# Patient Record
Sex: Female | Born: 1980 | Race: White | Hispanic: No | Marital: Married | Smoking: Never smoker
Health system: Southern US, Community
[De-identification: ages and names within clinical notes are randomized; demographics above are authoritative.]

---

## 2004-06-06 HISTORY — PX: AUGMENTATION MAMMAPLASTY: SUR837

## 2016-10-04 ENCOUNTER — Ambulatory Visit: Payer: Self-pay | Admitting: Physician Assistant

## 2016-10-04 ENCOUNTER — Encounter: Payer: Self-pay | Admitting: Physician Assistant

## 2016-10-04 VITALS — BP 110/80 | HR 73 | Temp 98.3°F

## 2016-10-04 DIAGNOSIS — L237 Allergic contact dermatitis due to plants, except food: Secondary | ICD-10-CM

## 2016-10-04 MED ORDER — DEXAMETHASONE SODIUM PHOSPHATE 10 MG/ML IJ SOLN
10.0000 mg | Freq: Once | INTRAMUSCULAR | Status: AC
  Administered 2016-10-04: 10 mg via INTRAMUSCULAR

## 2016-10-04 NOTE — Progress Notes (Signed)
S: c/o itchy rash on face and  arms,  was outside in yard and then broke out, sx for few days, tried multiple otc meds without relief, denies fever/chills, has a dose pack at home but would rather get the shot  O: vitals wnl, nad, lungs c t a, cv rrr, skin with small raised red areas some with streaks/blisters, no drainage, n/v intact  A: acute contact dermatitis  P: decadron 10 mg Im

## 2017-01-19 DIAGNOSIS — R1013 Epigastric pain: Secondary | ICD-10-CM | POA: Diagnosis not present

## 2017-01-19 DIAGNOSIS — Z800 Family history of malignant neoplasm of digestive organs: Secondary | ICD-10-CM | POA: Diagnosis not present

## 2017-01-19 DIAGNOSIS — Z1211 Encounter for screening for malignant neoplasm of colon: Secondary | ICD-10-CM | POA: Diagnosis not present

## 2017-01-19 DIAGNOSIS — K5904 Chronic idiopathic constipation: Secondary | ICD-10-CM | POA: Diagnosis not present

## 2017-01-19 DIAGNOSIS — K219 Gastro-esophageal reflux disease without esophagitis: Secondary | ICD-10-CM | POA: Diagnosis not present

## 2017-01-31 DIAGNOSIS — N907 Vulvar cyst: Secondary | ICD-10-CM | POA: Diagnosis not present

## 2017-03-06 DIAGNOSIS — Z1211 Encounter for screening for malignant neoplasm of colon: Secondary | ICD-10-CM | POA: Diagnosis not present

## 2017-03-06 DIAGNOSIS — Z800 Family history of malignant neoplasm of digestive organs: Secondary | ICD-10-CM | POA: Diagnosis not present

## 2017-03-21 DIAGNOSIS — Z01419 Encounter for gynecological examination (general) (routine) without abnormal findings: Secondary | ICD-10-CM | POA: Diagnosis not present

## 2018-11-21 DIAGNOSIS — Z1329 Encounter for screening for other suspected endocrine disorder: Secondary | ICD-10-CM | POA: Diagnosis not present

## 2018-11-21 DIAGNOSIS — Z719 Counseling, unspecified: Secondary | ICD-10-CM | POA: Diagnosis not present

## 2018-11-21 DIAGNOSIS — Z13228 Encounter for screening for other metabolic disorders: Secondary | ICD-10-CM | POA: Diagnosis not present

## 2018-11-21 DIAGNOSIS — Z01419 Encounter for gynecological examination (general) (routine) without abnormal findings: Secondary | ICD-10-CM | POA: Diagnosis not present

## 2018-11-21 DIAGNOSIS — Z13220 Encounter for screening for lipoid disorders: Secondary | ICD-10-CM | POA: Diagnosis not present

## 2018-11-21 DIAGNOSIS — Z9229 Personal history of other drug therapy: Secondary | ICD-10-CM | POA: Diagnosis not present

## 2018-11-28 ENCOUNTER — Other Ambulatory Visit: Payer: Self-pay | Admitting: Family

## 2018-11-28 DIAGNOSIS — Z1231 Encounter for screening mammogram for malignant neoplasm of breast: Secondary | ICD-10-CM

## 2018-12-11 DIAGNOSIS — H5213 Myopia, bilateral: Secondary | ICD-10-CM | POA: Diagnosis not present

## 2018-12-11 DIAGNOSIS — Z0100 Encounter for examination of eyes and vision without abnormal findings: Secondary | ICD-10-CM | POA: Diagnosis not present

## 2019-01-14 DIAGNOSIS — N898 Other specified noninflammatory disorders of vagina: Secondary | ICD-10-CM | POA: Diagnosis not present

## 2019-02-07 ENCOUNTER — Ambulatory Visit
Admission: RE | Admit: 2019-02-07 | Discharge: 2019-02-07 | Disposition: A | Discharge status: Home / Self Care | Payer: 59 | Source: Ambulatory Visit | Attending: Family | Admitting: Family

## 2019-02-07 ENCOUNTER — Other Ambulatory Visit: Payer: Self-pay | Admitting: Family

## 2019-02-07 DIAGNOSIS — Z1231 Encounter for screening mammogram for malignant neoplasm of breast: Secondary | ICD-10-CM | POA: Diagnosis not present

## 2019-02-18 ENCOUNTER — Other Ambulatory Visit: Payer: Self-pay | Admitting: Family

## 2019-02-18 DIAGNOSIS — N6489 Other specified disorders of breast: Secondary | ICD-10-CM

## 2019-02-18 DIAGNOSIS — R928 Other abnormal and inconclusive findings on diagnostic imaging of breast: Secondary | ICD-10-CM

## 2019-02-27 ENCOUNTER — Ambulatory Visit
Admission: RE | Admit: 2019-02-27 | Discharge: 2019-02-27 | Disposition: A | Discharge status: Home / Self Care | Payer: 59 | Source: Ambulatory Visit | Attending: Family | Admitting: Family

## 2019-02-27 DIAGNOSIS — R928 Other abnormal and inconclusive findings on diagnostic imaging of breast: Secondary | ICD-10-CM

## 2019-02-27 DIAGNOSIS — N6321 Unspecified lump in the left breast, upper outer quadrant: Secondary | ICD-10-CM | POA: Diagnosis not present

## 2019-02-27 DIAGNOSIS — R922 Inconclusive mammogram: Secondary | ICD-10-CM | POA: Diagnosis not present

## 2019-02-27 DIAGNOSIS — N6489 Other specified disorders of breast: Secondary | ICD-10-CM | POA: Diagnosis not present

## 2019-02-27 DIAGNOSIS — N6323 Unspecified lump in the left breast, lower outer quadrant: Secondary | ICD-10-CM | POA: Diagnosis not present

## 2019-03-06 ENCOUNTER — Other Ambulatory Visit: Payer: Self-pay | Admitting: Family

## 2019-03-06 DIAGNOSIS — N6320 Unspecified lump in the left breast, unspecified quadrant: Secondary | ICD-10-CM

## 2019-07-03 ENCOUNTER — Other Ambulatory Visit
Admission: RE | Admit: 2019-07-03 | Discharge: 2019-07-03 | Disposition: A | Discharge status: Home / Self Care | Payer: 59 | Source: Ambulatory Visit | Attending: Plastic Surgery | Admitting: Plastic Surgery

## 2019-07-03 DIAGNOSIS — D509 Iron deficiency anemia, unspecified: Secondary | ICD-10-CM | POA: Insufficient documentation

## 2019-07-03 LAB — CBC
HCT: 33.1 % — ABNORMAL LOW (ref 36.0–46.0)
Hemoglobin: 10.7 g/dL — ABNORMAL LOW (ref 12.0–15.0)
MCH: 29 pg (ref 26.0–34.0)
MCHC: 32.3 g/dL (ref 30.0–36.0)
MCV: 89.7 fL (ref 80.0–100.0)
Platelets: 288 10*3/uL (ref 150–400)
RBC: 3.69 MIL/uL — ABNORMAL LOW (ref 3.87–5.11)
RDW: 13.4 % (ref 11.5–15.5)
WBC: 7.1 10*3/uL (ref 4.0–10.5)
nRBC: 0 % (ref 0.0–0.2)

## 2019-07-18 DIAGNOSIS — Z719 Counseling, unspecified: Secondary | ICD-10-CM | POA: Diagnosis not present

## 2019-07-18 DIAGNOSIS — D649 Anemia, unspecified: Secondary | ICD-10-CM | POA: Diagnosis not present

## 2019-07-18 DIAGNOSIS — R3915 Urgency of urination: Secondary | ICD-10-CM | POA: Diagnosis not present

## 2019-08-19 ENCOUNTER — Other Ambulatory Visit
Admission: RE | Admit: 2019-08-19 | Discharge: 2019-08-19 | Disposition: A | Discharge status: Home / Self Care | Payer: 59 | Source: Ambulatory Visit | Attending: Family | Admitting: Family

## 2019-08-19 DIAGNOSIS — D649 Anemia, unspecified: Secondary | ICD-10-CM | POA: Diagnosis not present

## 2019-08-19 LAB — CBC WITH DIFFERENTIAL/PLATELET
Abs Immature Granulocytes: 0.01 10*3/uL (ref 0.00–0.07)
Basophils Absolute: 0 10*3/uL (ref 0.0–0.1)
Basophils Relative: 0 %
Eosinophils Absolute: 0 10*3/uL (ref 0.0–0.5)
Eosinophils Relative: 0 %
HCT: 36.6 % (ref 36.0–46.0)
Hemoglobin: 11.7 g/dL — ABNORMAL LOW (ref 12.0–15.0)
Immature Granulocytes: 0 %
Lymphocytes Relative: 31 %
Lymphs Abs: 1.7 10*3/uL (ref 0.7–4.0)
MCH: 29 pg (ref 26.0–34.0)
MCHC: 32 g/dL (ref 30.0–36.0)
MCV: 90.8 fL (ref 80.0–100.0)
Monocytes Absolute: 0.5 10*3/uL (ref 0.1–1.0)
Monocytes Relative: 8 %
Neutro Abs: 3.5 10*3/uL (ref 1.7–7.7)
Neutrophils Relative %: 61 %
Platelets: 283 10*3/uL (ref 150–400)
RBC: 4.03 MIL/uL (ref 3.87–5.11)
RDW: 13.4 % (ref 11.5–15.5)
WBC: 5.7 10*3/uL (ref 4.0–10.5)
nRBC: 0 % (ref 0.0–0.2)

## 2019-08-19 LAB — IRON AND TIBC
Iron: 88 ug/dL (ref 28–170)
Saturation Ratios: 22 % (ref 10.4–31.8)
TIBC: 406 ug/dL (ref 250–450)
UIBC: 318 ug/dL

## 2019-10-21 ENCOUNTER — Ambulatory Visit
Admission: RE | Admit: 2019-10-21 | Discharge: 2019-10-21 | Disposition: A | Discharge status: Home / Self Care | Payer: 59 | Source: Ambulatory Visit | Attending: Family | Admitting: Family

## 2019-10-21 DIAGNOSIS — N6320 Unspecified lump in the left breast, unspecified quadrant: Secondary | ICD-10-CM | POA: Diagnosis not present

## 2019-10-23 ENCOUNTER — Other Ambulatory Visit: Payer: Self-pay | Admitting: Family

## 2019-10-23 DIAGNOSIS — N6320 Unspecified lump in the left breast, unspecified quadrant: Secondary | ICD-10-CM

## 2020-04-07 IMAGING — MG MM DIGITAL DIAGNOSTIC UNILAT*L* IMPLANT W/ TOMO W/ CAD
8 series · 9 of 24 positions shown · non-contrast
Comparison: Baseline screening mammogram 02/07/2019

CLINICAL DATA: Screening recall from baseline for a left breast
asymmetry.

EXAM:
DIGITAL DIAGNOSTIC LEFT MAMMOGRAM WITH IMPLANTS, CAD AND TOMO
ULTRASOUND LEFT BREAST
The patient has retropectoral implants. Standard and implant
displaced views were performed.

[L MLO synth-2D]
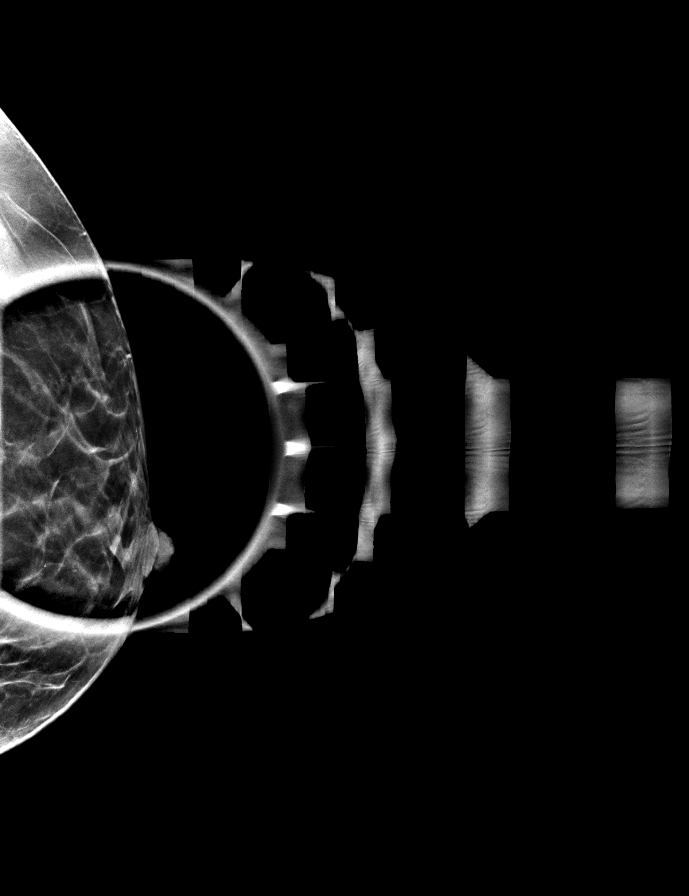

[L ML synth-2D (1 of 2)]
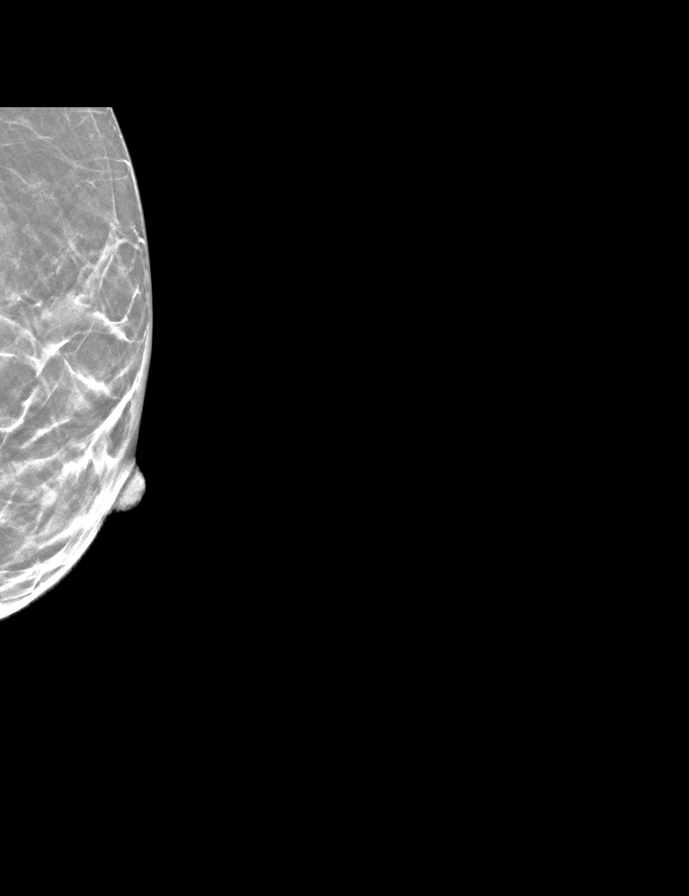

[L CC synth-2D]
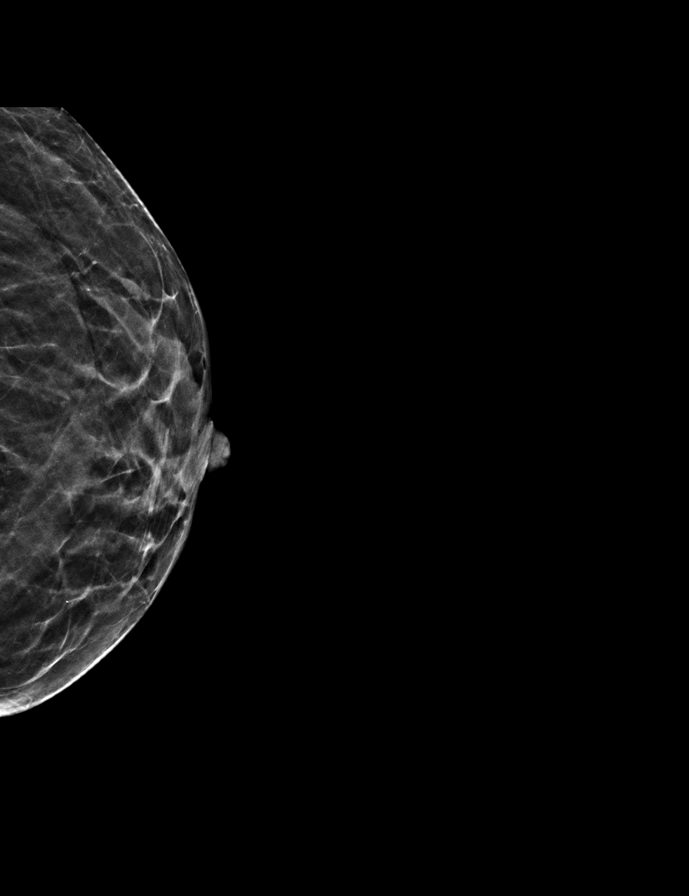

[L ML synth-2D (2 of 2)]
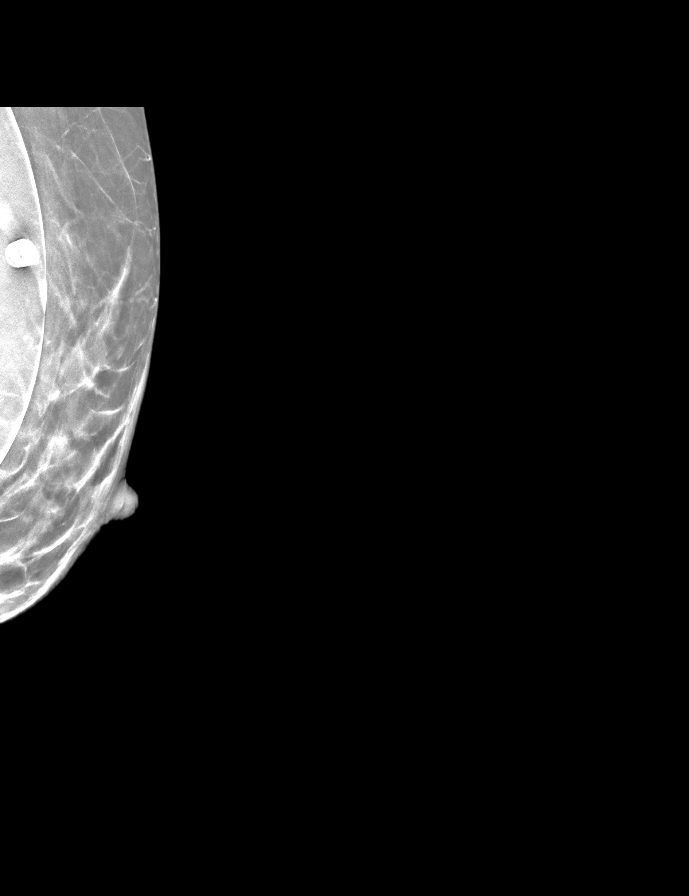

[L MLID BREAST TOMOSYNTHESIS IMAGE tomo · 2 of 44 frames shown (1 of 2)]
[frame 15/44]
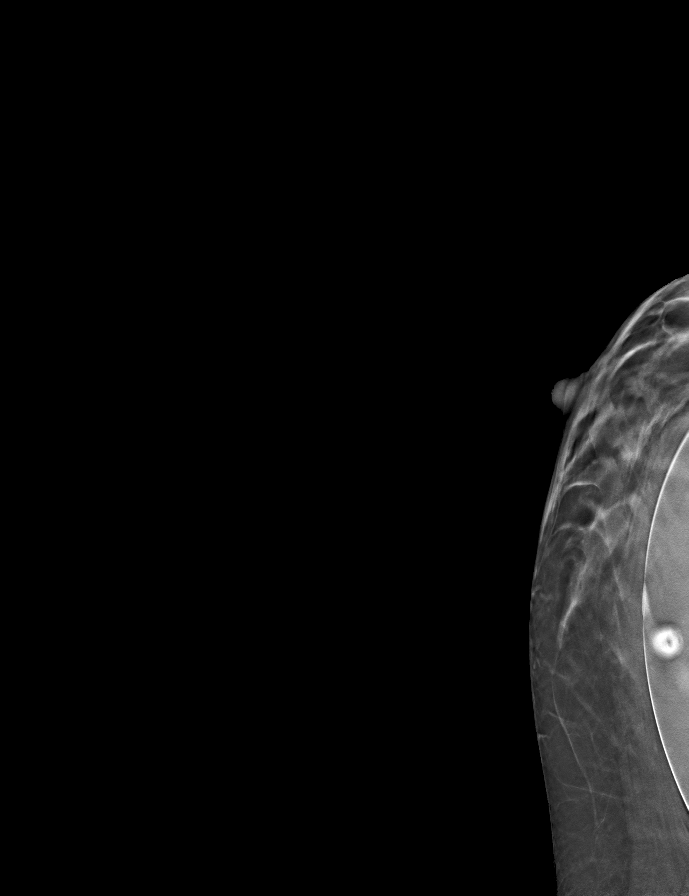
[frame 23/44]
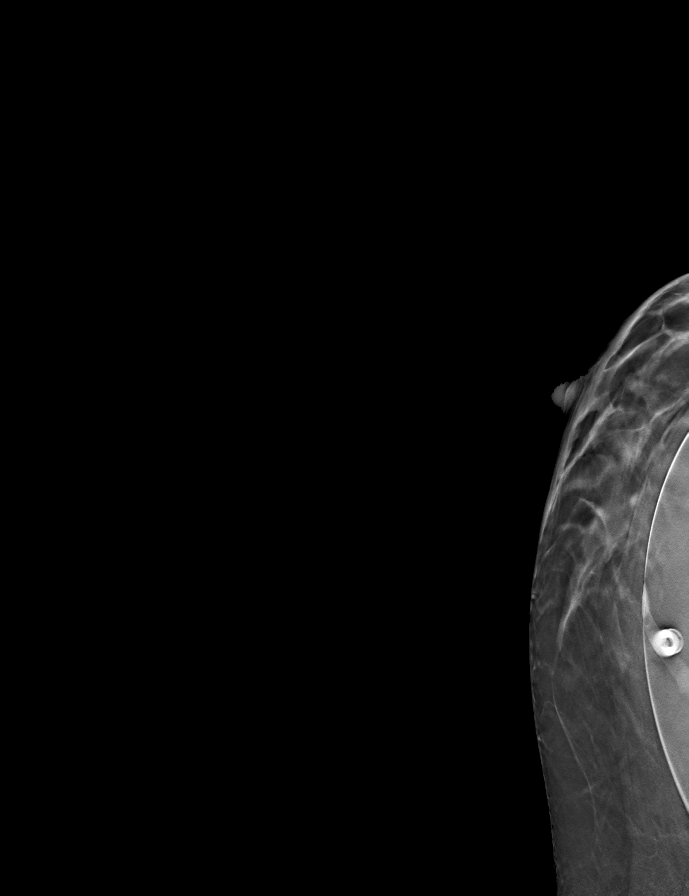

[L CCID BREAST TOMOSYNTHESIS IMAGE tomo · tomo slice 17/33.0]
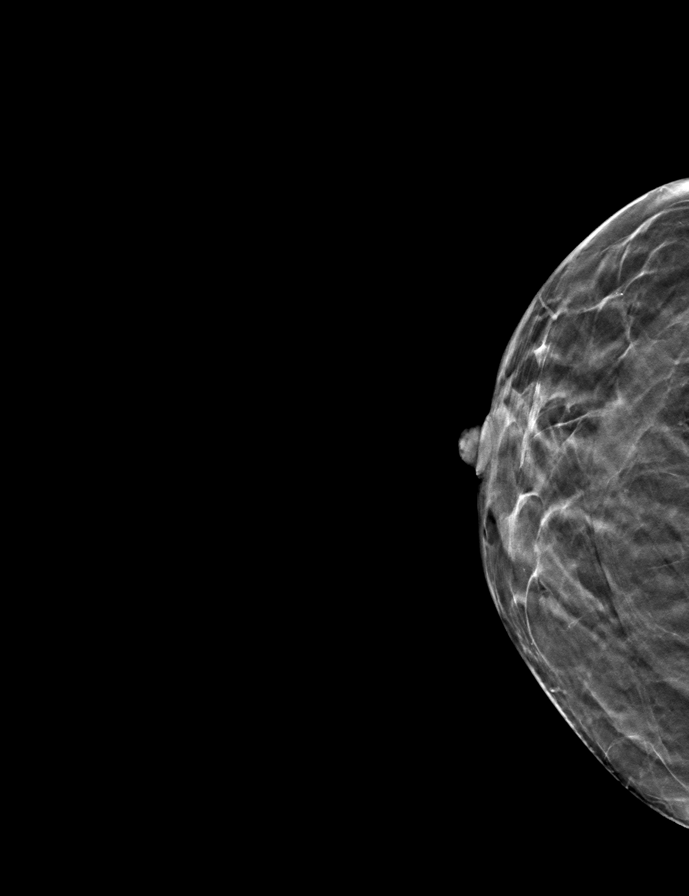

[L MLID BREAST TOMOSYNTHESIS IMAGE tomo (2 of 2) · tomo slice 15/29.0]
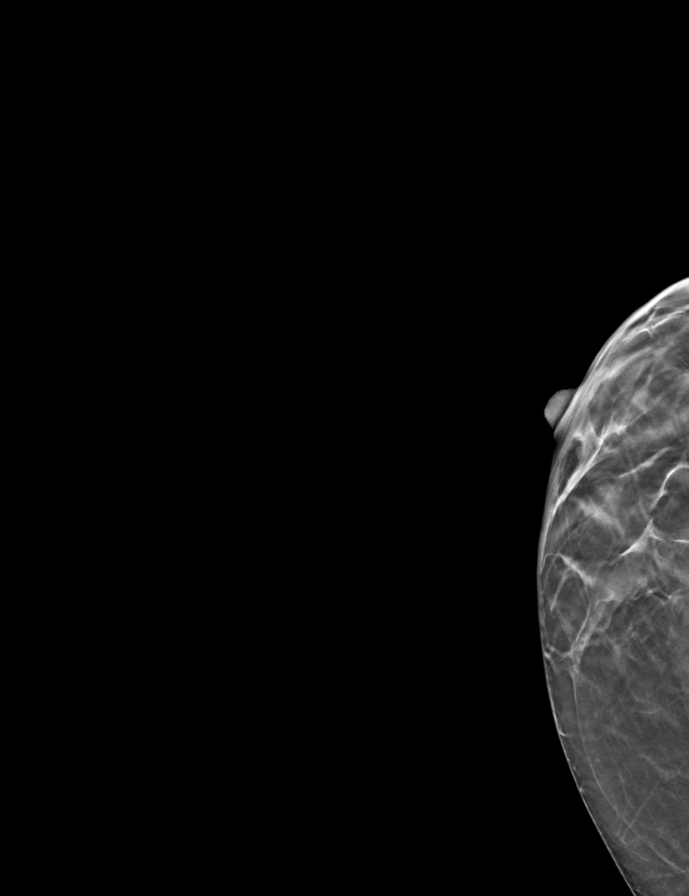

[L MLOID BREAST TOMOSYNTHESIS IMAGE tomo · tomo slice 19/37.0]
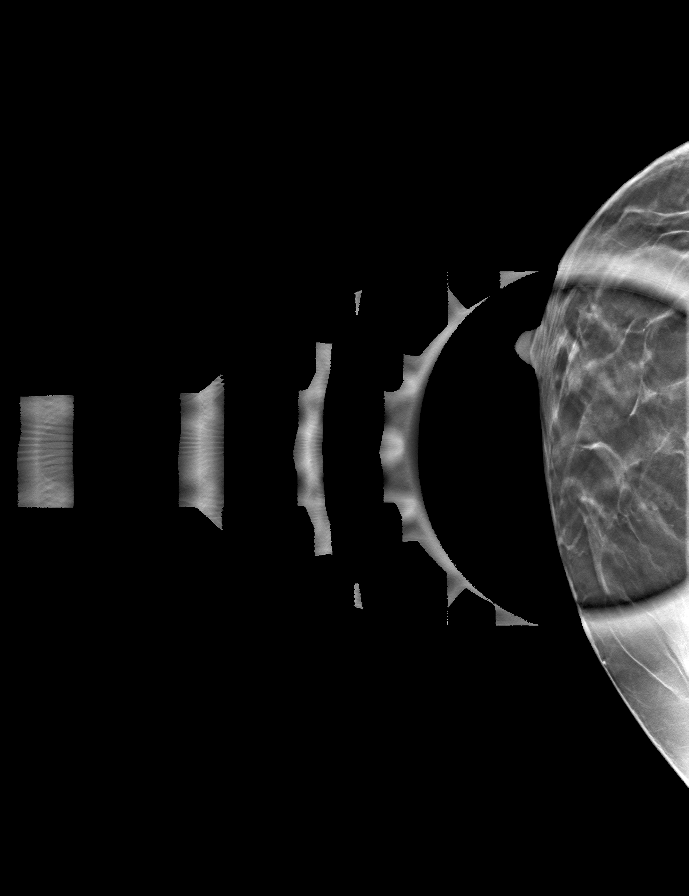

[9 of 24 positions shown; findings below may reference images not displayed]

ACR Breast Density Category c: The breast tissue is heterogeneously
dense, which may obscure small masses.
FINDINGS: Spot compression tomosynthesis images reveal a persistent oval
circumscribed mass in the lateral left breast.

Ultrasound targeted to the left breast at 3 o'clock, 2 cm from the
nipple demonstrates a hypoechoic oval circumscribed mass measuring 9
x 2 x 6 mm.

Mammographic images were processed with CAD.
IMPRESSION: 1. The left breast mass at 3 o'clock is likely benign, possibly
representing a fibroadenoma.

RECOMMENDATION:
Six-month follow-up left breast ultrasound

I have discussed the findings and recommendations with the patient.
If applicable, a reminder letter will be sent to the patient
regarding the next appointment.

BI-RADS CATEGORY  3: Probably benign.

## 2020-04-07 IMAGING — US US BREAST*L* LIMITED INC AXILLA
1 series · 5 of 5 positions shown · non-contrast
Comparison: Baseline screening mammogram 02/07/2019

CLINICAL DATA: Screening recall from baseline for a left breast
asymmetry.

EXAM:
DIGITAL DIAGNOSTIC LEFT MAMMOGRAM WITH IMPLANTS, CAD AND TOMO
ULTRASOUND LEFT BREAST
The patient has retropectoral implants. Standard and implant
displaced views were performed.

[Series 1: us breast*left* limited inc axilla · 0.05mm/px · 5 of 5 slices shown]
[im 1/5]
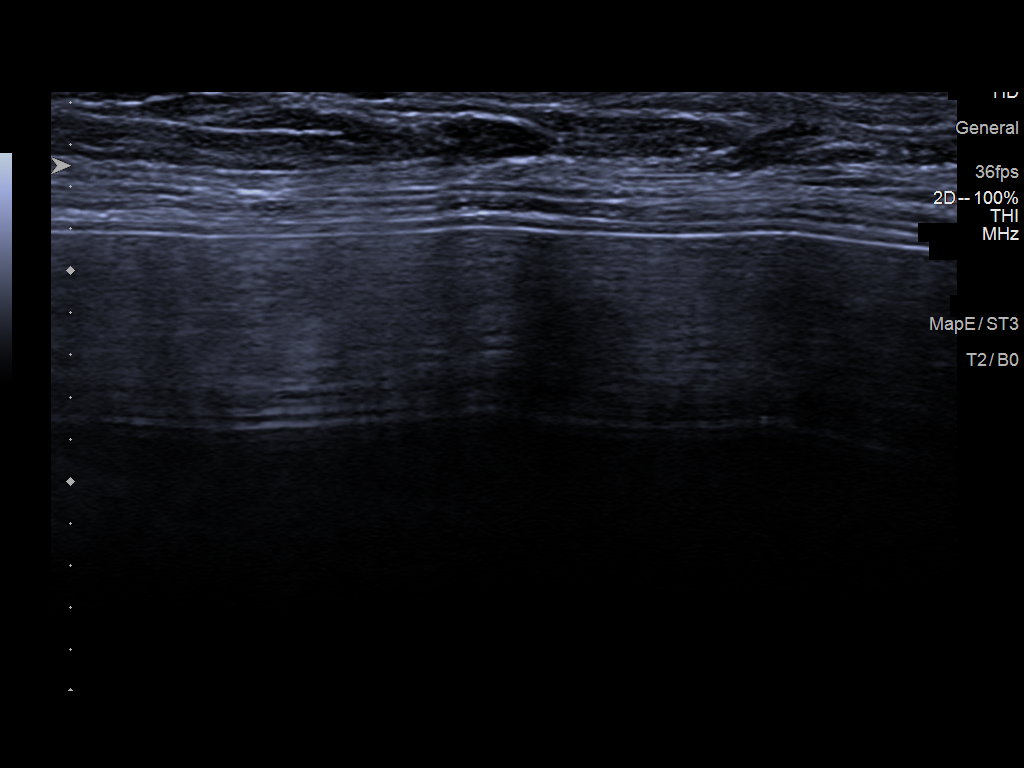
[im 2/5]
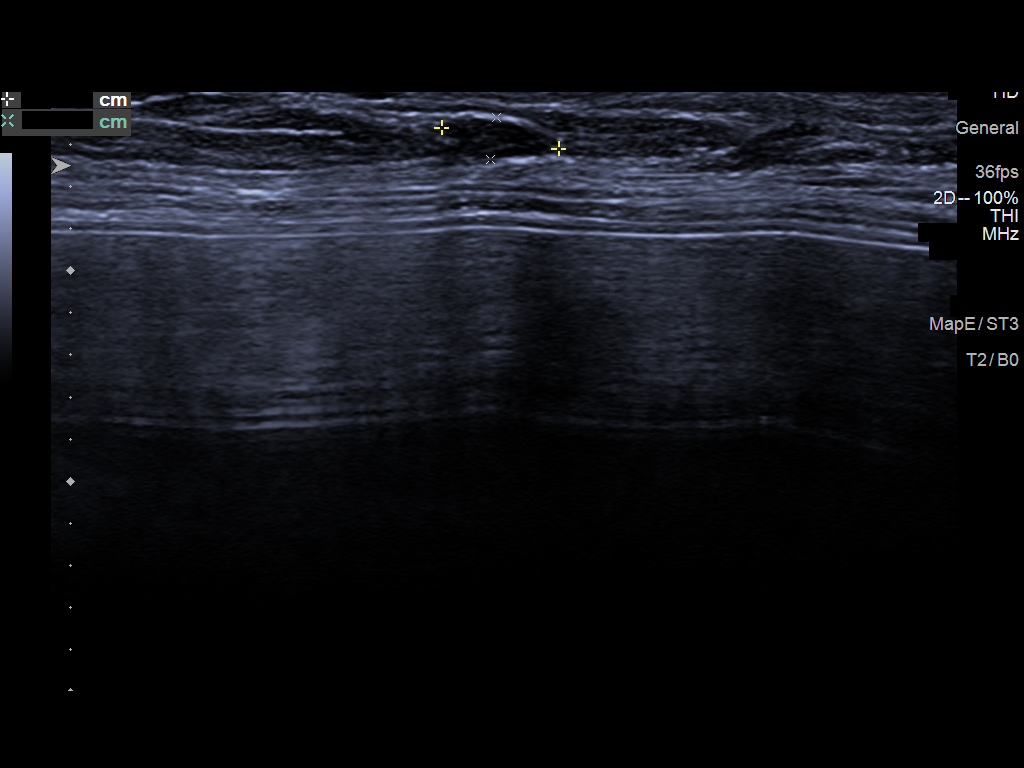
[im 3/5]
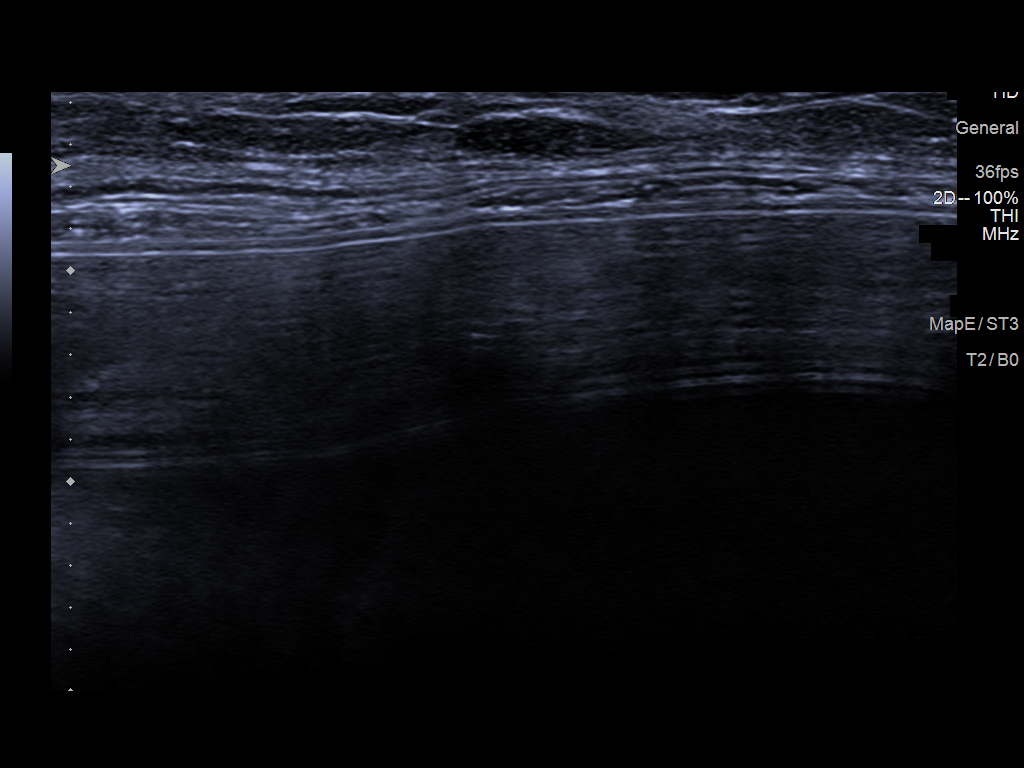
[im 4/5]
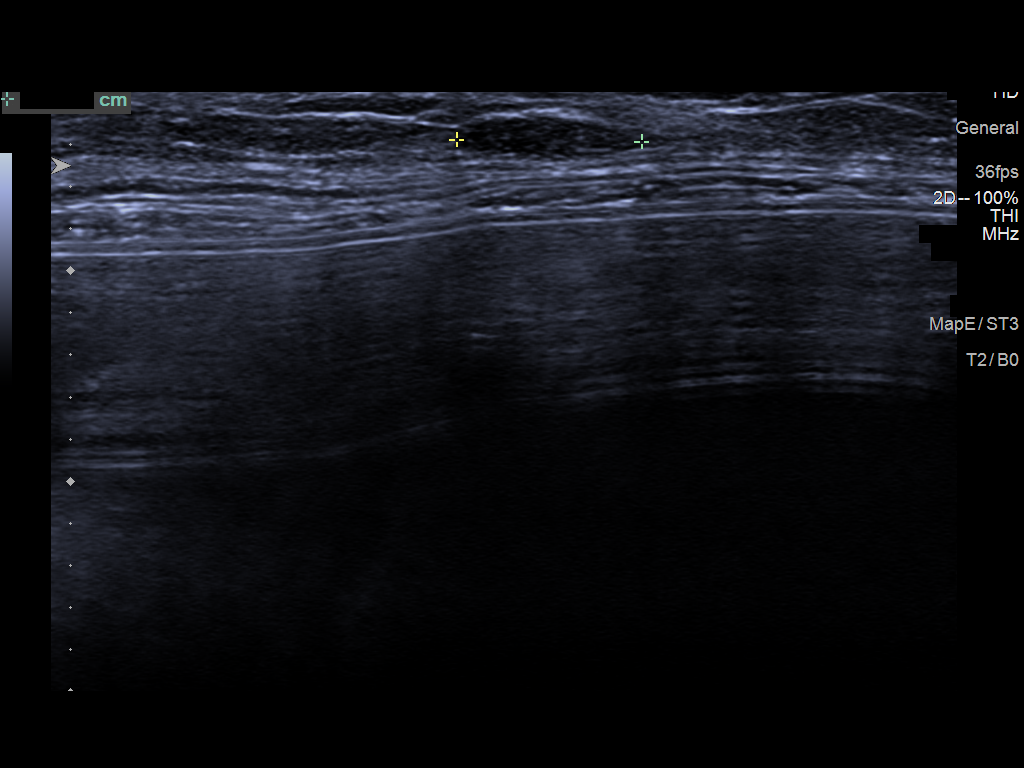
[im 5/5]
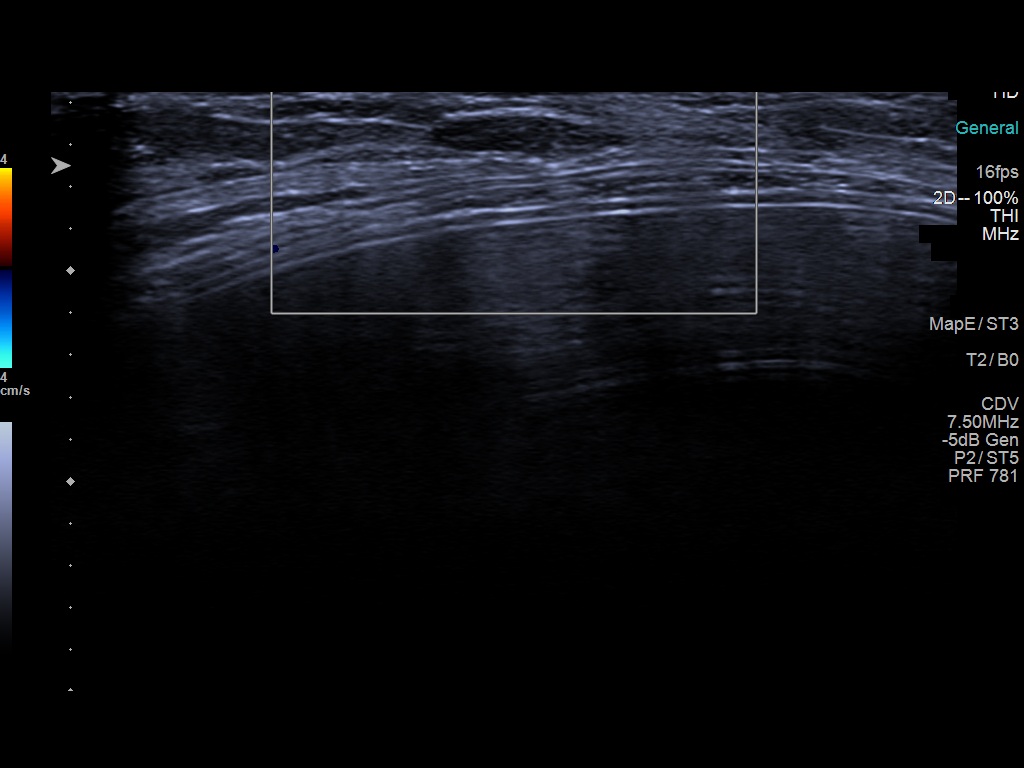

[5 of 5 positions shown; findings below may reference images not displayed]

ACR Breast Density Category c: The breast tissue is heterogeneously
dense, which may obscure small masses.
FINDINGS: Spot compression tomosynthesis images reveal a persistent oval
circumscribed mass in the lateral left breast.

Ultrasound targeted to the left breast at 3 o'clock, 2 cm from the
nipple demonstrates a hypoechoic oval circumscribed mass measuring 9
x 2 x 6 mm.

Mammographic images were processed with CAD.
IMPRESSION: 1. The left breast mass at 3 o'clock is likely benign, possibly
representing a fibroadenoma.

RECOMMENDATION:
Six-month follow-up left breast ultrasound

I have discussed the findings and recommendations with the patient.
If applicable, a reminder letter will be sent to the patient
regarding the next appointment.

BI-RADS CATEGORY  3: Probably benign.

## 2020-11-29 IMAGING — US US BREAST*L* LIMITED INC AXILLA
1 series · 5 of 5 positions shown · non-contrast
Comparison: Previous exam(s).

CLINICAL DATA: 38-year-old female presenting for short-term
follow-up of a probably benign left breast mass. Patient had her
breast implants removed in the interval.

EXAM:
ULTRASOUND OF THE LEFT BREAST

[Series 1: us breast*left* limited inc axilla · 0.06mm/px · 5 of 5 slices shown]
[im 1/5]
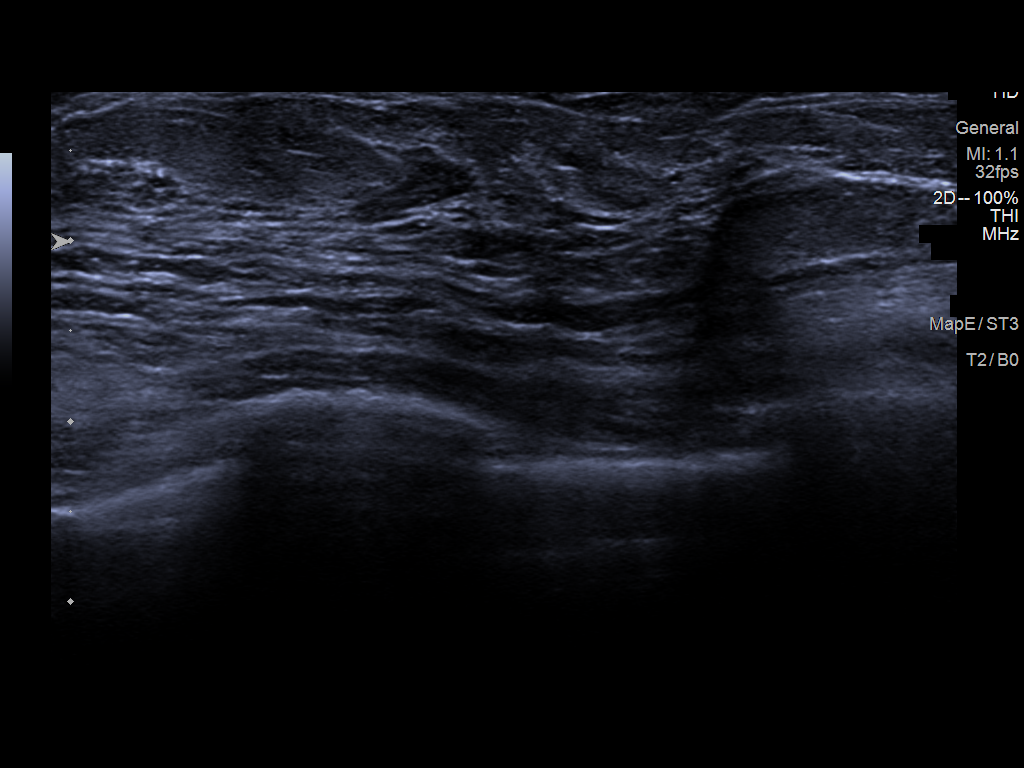
[im 2/5]
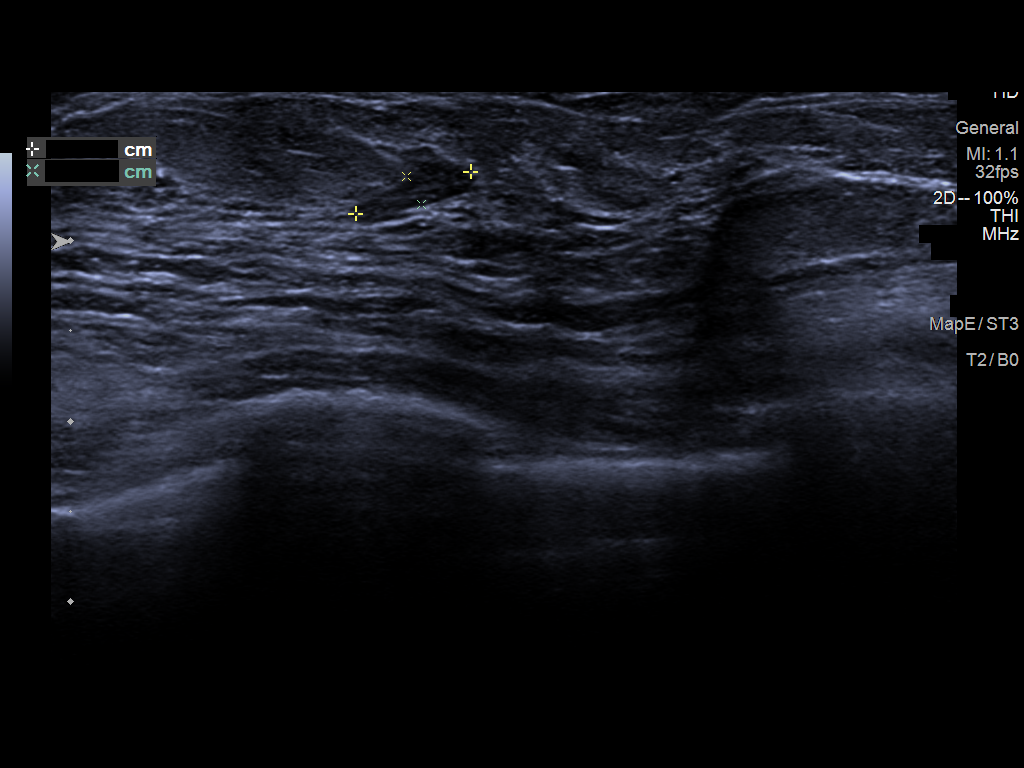
[im 3/5]
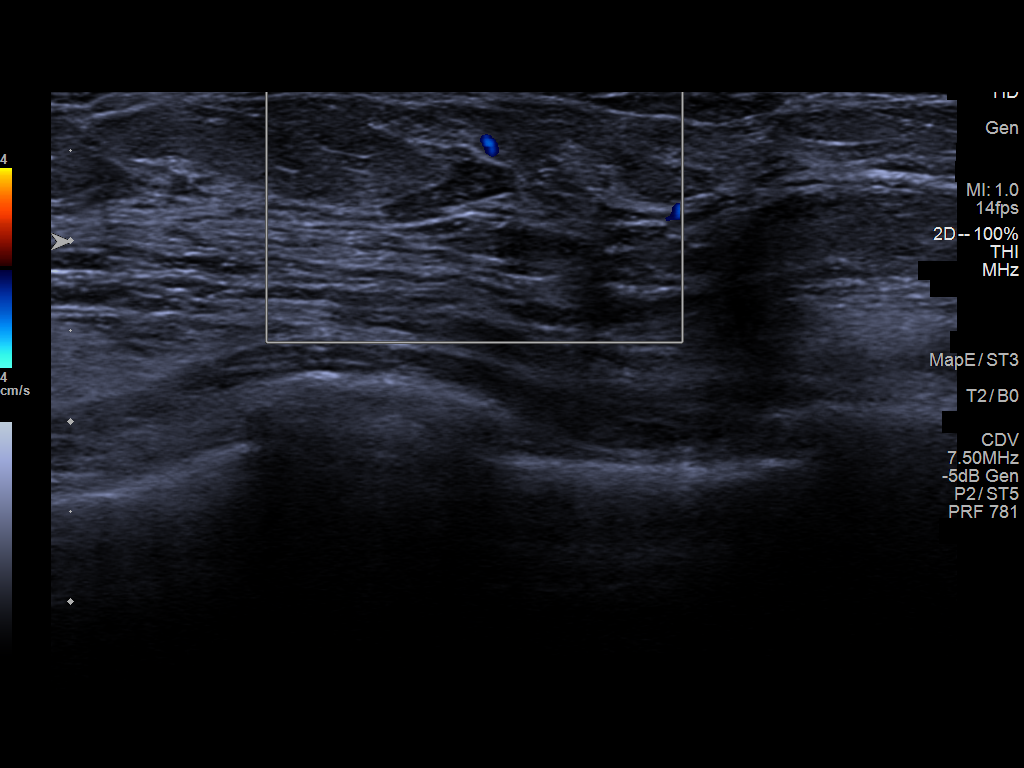
[im 4/5]
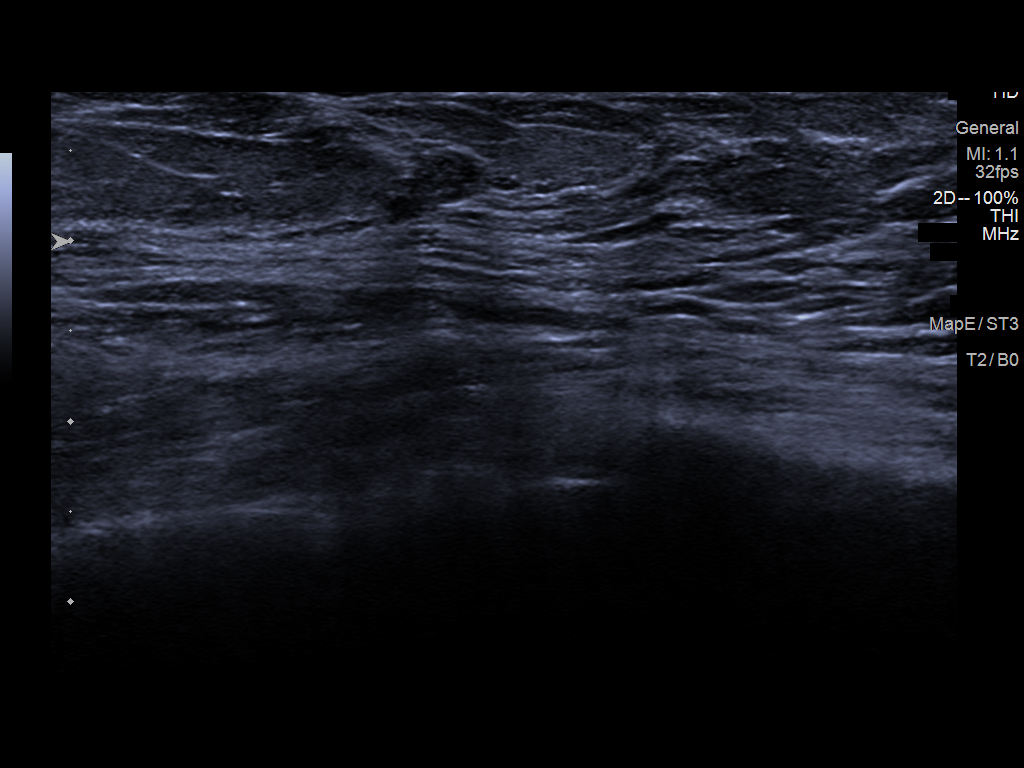
[im 5/5]
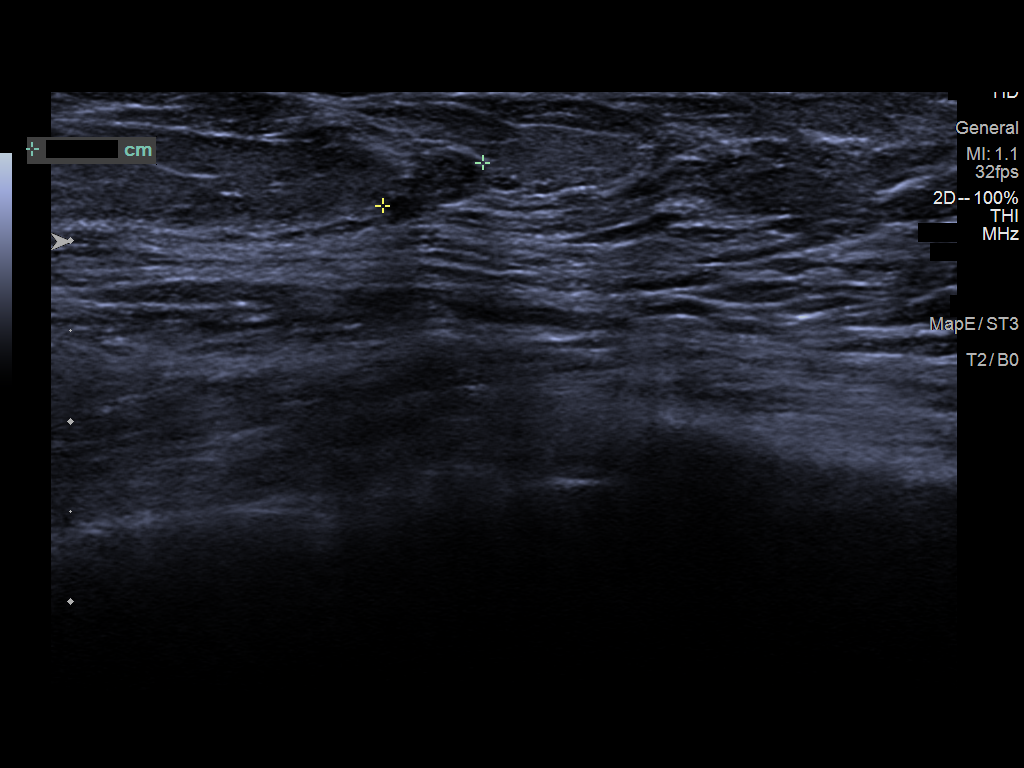

[5 of 5 positions shown; findings below may reference images not displayed]

FINDINGS: Targeted ultrasound is performed in the left breast at 2:30 o'clock
2 cm from the nipple demonstrating an oval circumscribed hypoechoic
mass measuring 0.7 x 0.2 x 0.6 cm. This likely corresponds to the
mass previously seen at 3 o'clock 2 cm from the nipple measuring
x 0.2 x 0.6 cm. This orientation of the mass is somewhat different
from the prior study which may be secondary to implant removal.
There is no additional mass in the outer left breast.
IMPRESSION: Left breast mass at 2:30 o'clock measuring 0.7 cm is probably
benign.

RECOMMENDATION:
Diagnostic left breast mammogram and ultrasound in 6 months to
confirm 1 year stability of the probably benign left breast mass.

I have discussed the findings and recommendations with the patient.
If applicable, a reminder letter will be sent to the patient
regarding the next appointment.

BI-RADS CATEGORY  3: Probably benign.

## 2022-03-16 ENCOUNTER — Other Ambulatory Visit: Payer: Self-pay

## 2022-03-16 DIAGNOSIS — N6002 Solitary cyst of left breast: Secondary | ICD-10-CM

## 2022-04-15 ENCOUNTER — Ambulatory Visit
Admission: RE | Admit: 2022-04-15 | Discharge: 2022-04-15 | Disposition: A | Discharge status: Home / Self Care | Payer: 59 | Source: Ambulatory Visit

## 2022-04-15 ENCOUNTER — Other Ambulatory Visit: Payer: Self-pay

## 2022-04-15 DIAGNOSIS — N6002 Solitary cyst of left breast: Secondary | ICD-10-CM

## 2023-03-09 ENCOUNTER — Other Ambulatory Visit: Payer: Self-pay

## 2023-03-09 DIAGNOSIS — Z1231 Encounter for screening mammogram for malignant neoplasm of breast: Secondary | ICD-10-CM

## 2023-04-18 ENCOUNTER — Ambulatory Visit
Admission: RE | Admit: 2023-04-18 | Discharge: 2023-04-18 | Disposition: A | Discharge status: Home / Self Care | Payer: 59 | Source: Ambulatory Visit

## 2023-04-18 DIAGNOSIS — Z1231 Encounter for screening mammogram for malignant neoplasm of breast: Secondary | ICD-10-CM | POA: Diagnosis present

## 2023-04-26 ENCOUNTER — Other Ambulatory Visit: Payer: Self-pay | Admitting: Medical Genetics

## 2023-04-26 DIAGNOSIS — Z006 Encounter for examination for normal comparison and control in clinical research program: Secondary | ICD-10-CM

## 2023-05-18 ENCOUNTER — Other Ambulatory Visit (HOSPITAL_COMMUNITY)
Admission: RE | Admit: 2023-05-18 | Discharge: 2023-05-18 | Disposition: A | Discharge status: Home / Self Care | Payer: 59 | Source: Ambulatory Visit | Attending: Medical Genetics | Admitting: Medical Genetics

## 2023-05-18 DIAGNOSIS — Z006 Encounter for examination for normal comparison and control in clinical research program: Secondary | ICD-10-CM | POA: Insufficient documentation

## 2023-05-27 LAB — GENECONNECT MOLECULAR SCREEN: Genetic Analysis Overall Interpretation: NEGATIVE

## 2024-04-22 ENCOUNTER — Other Ambulatory Visit: Payer: Self-pay

## 2024-04-22 DIAGNOSIS — Z1231 Encounter for screening mammogram for malignant neoplasm of breast: Secondary | ICD-10-CM

## 2024-05-28 ENCOUNTER — Ambulatory Visit
Admission: RE | Admit: 2024-05-28 | Discharge: 2024-05-28 | Disposition: A | Discharge status: Home / Self Care | Source: Ambulatory Visit

## 2024-05-28 DIAGNOSIS — Z1231 Encounter for screening mammogram for malignant neoplasm of breast: Secondary | ICD-10-CM | POA: Diagnosis present
